# Patient Record
Sex: Male | Born: 1990 | Race: White | Hispanic: No | Marital: Single
Health system: Southern US, Community
[De-identification: ages and names within clinical notes are randomized; demographics above are authoritative.]

---
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
## 2013-10-22 ENCOUNTER — Ambulatory Visit
Admission: RE | Admit: 2013-10-22 | Discharge: 2013-10-22 | Disposition: A | Discharge status: Home / Self Care | Payer: Managed Care, Other (non HMO) | Source: Ambulatory Visit | Attending: Internal Medicine | Admitting: Internal Medicine

## 2013-10-22 ENCOUNTER — Other Ambulatory Visit: Payer: Self-pay | Admitting: Internal Medicine

## 2013-10-22 DIAGNOSIS — S62309A Unspecified fracture of unspecified metacarpal bone, initial encounter for closed fracture: Secondary | ICD-10-CM

## 2017-09-26 ENCOUNTER — Other Ambulatory Visit: Payer: Self-pay | Admitting: Internal Medicine

## 2017-09-26 ENCOUNTER — Ambulatory Visit
Admission: RE | Admit: 2017-09-26 | Discharge: 2017-09-26 | Disposition: A | Discharge status: Home / Self Care | Payer: PRIVATE HEALTH INSURANCE | Source: Ambulatory Visit | Attending: Internal Medicine | Admitting: Internal Medicine

## 2017-09-26 DIAGNOSIS — R609 Edema, unspecified: Secondary | ICD-10-CM

## 2017-09-26 DIAGNOSIS — R52 Pain, unspecified: Secondary | ICD-10-CM

## 2018-08-06 DIAGNOSIS — F909 Attention-deficit hyperactivity disorder, unspecified type: Secondary | ICD-10-CM | POA: Diagnosis not present

## 2018-09-20 IMAGING — CR DG ANKLE COMPLETE 3+V*L*
3 series · 3 of 3 positions shown · non-contrast
Comparison: None.

CLINICAL DATA: Anterior left ankle pain for 2 years.

EXAM:
LEFT ANKLE COMPLETE - 3+ VIEW

[x ankle ap left]
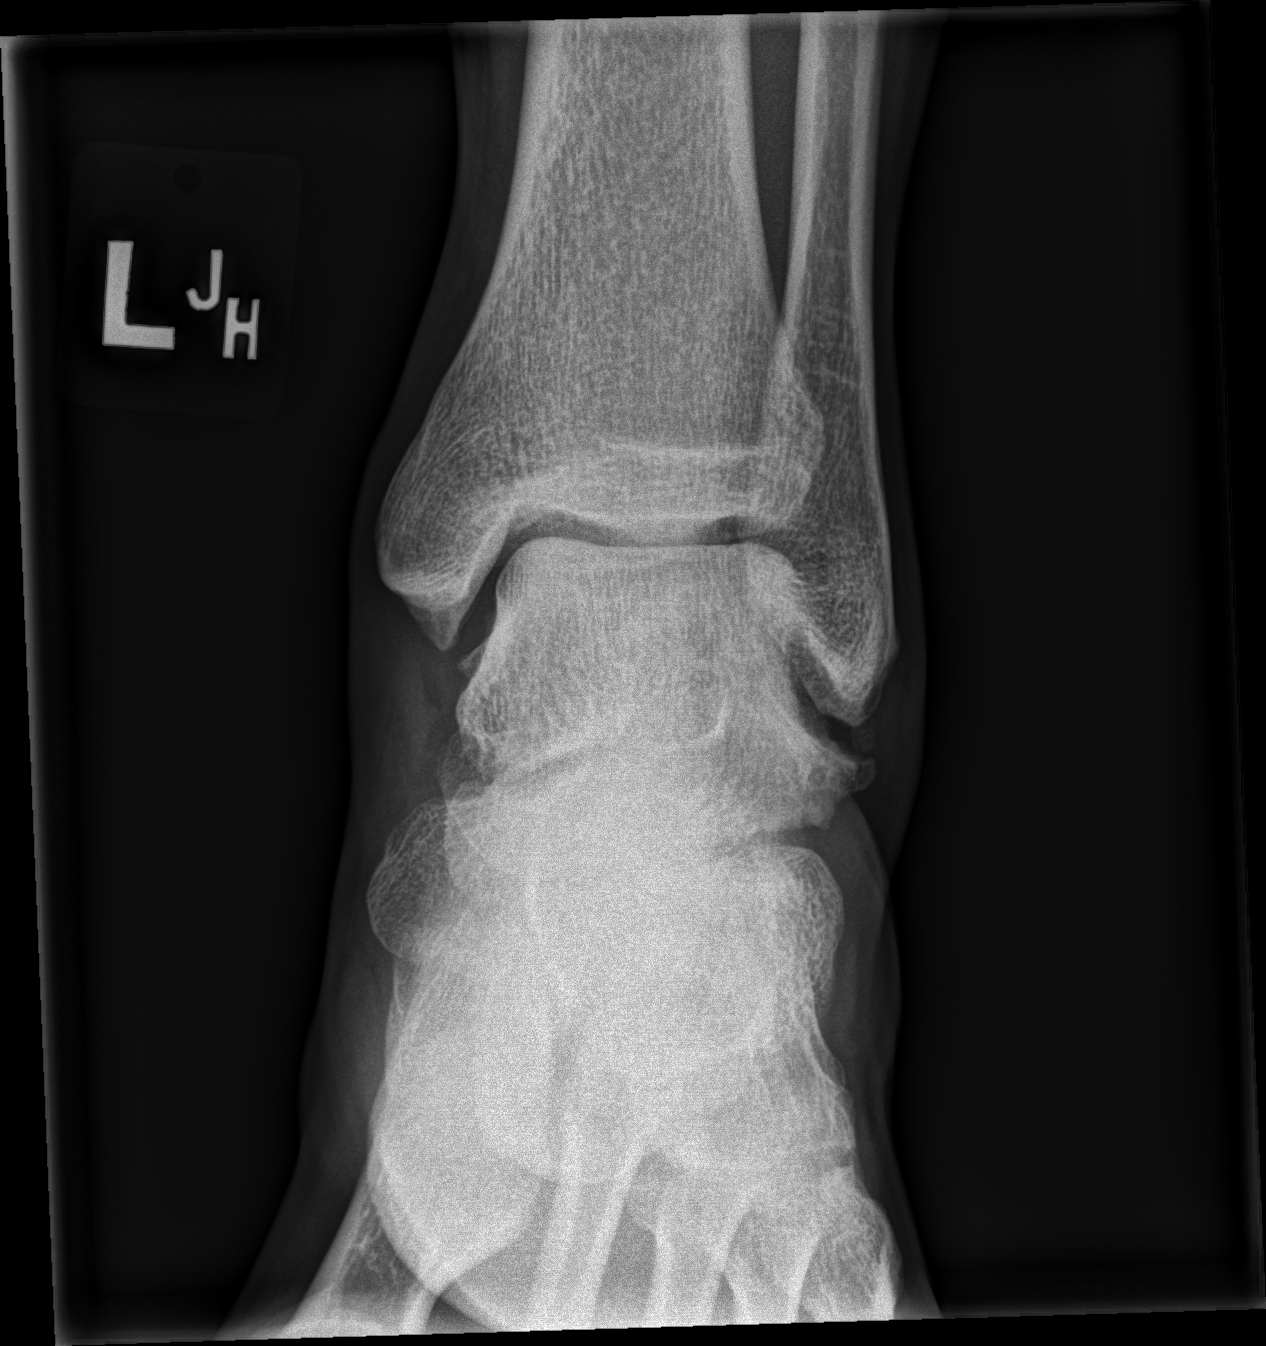

[x ankle obl left]
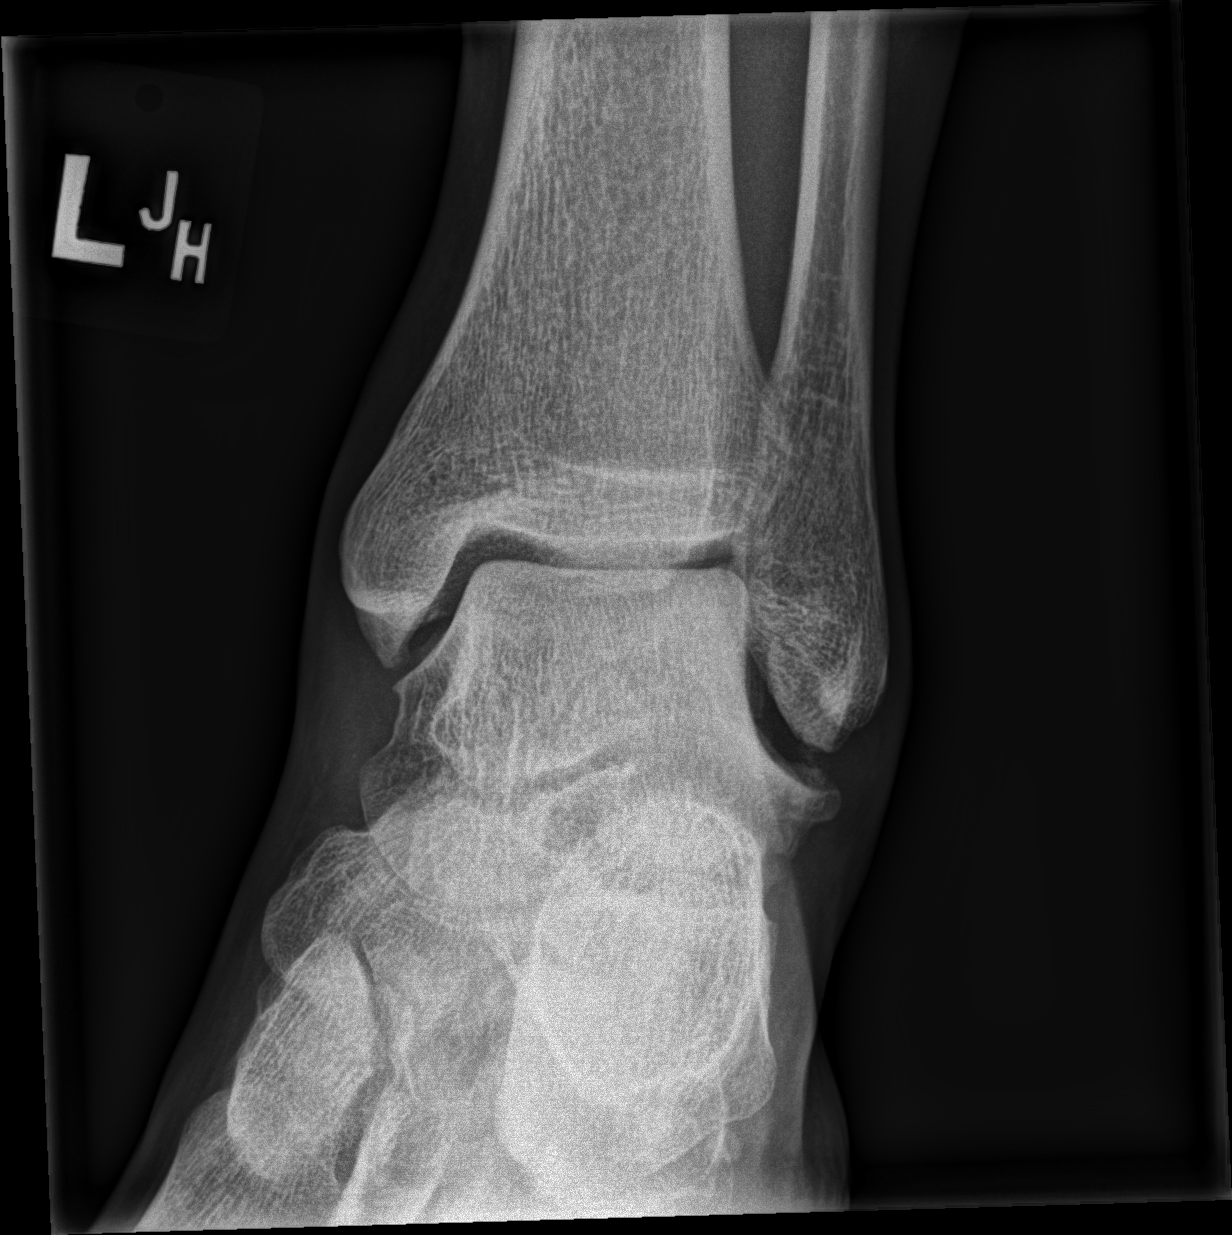

[x ankle lat left]
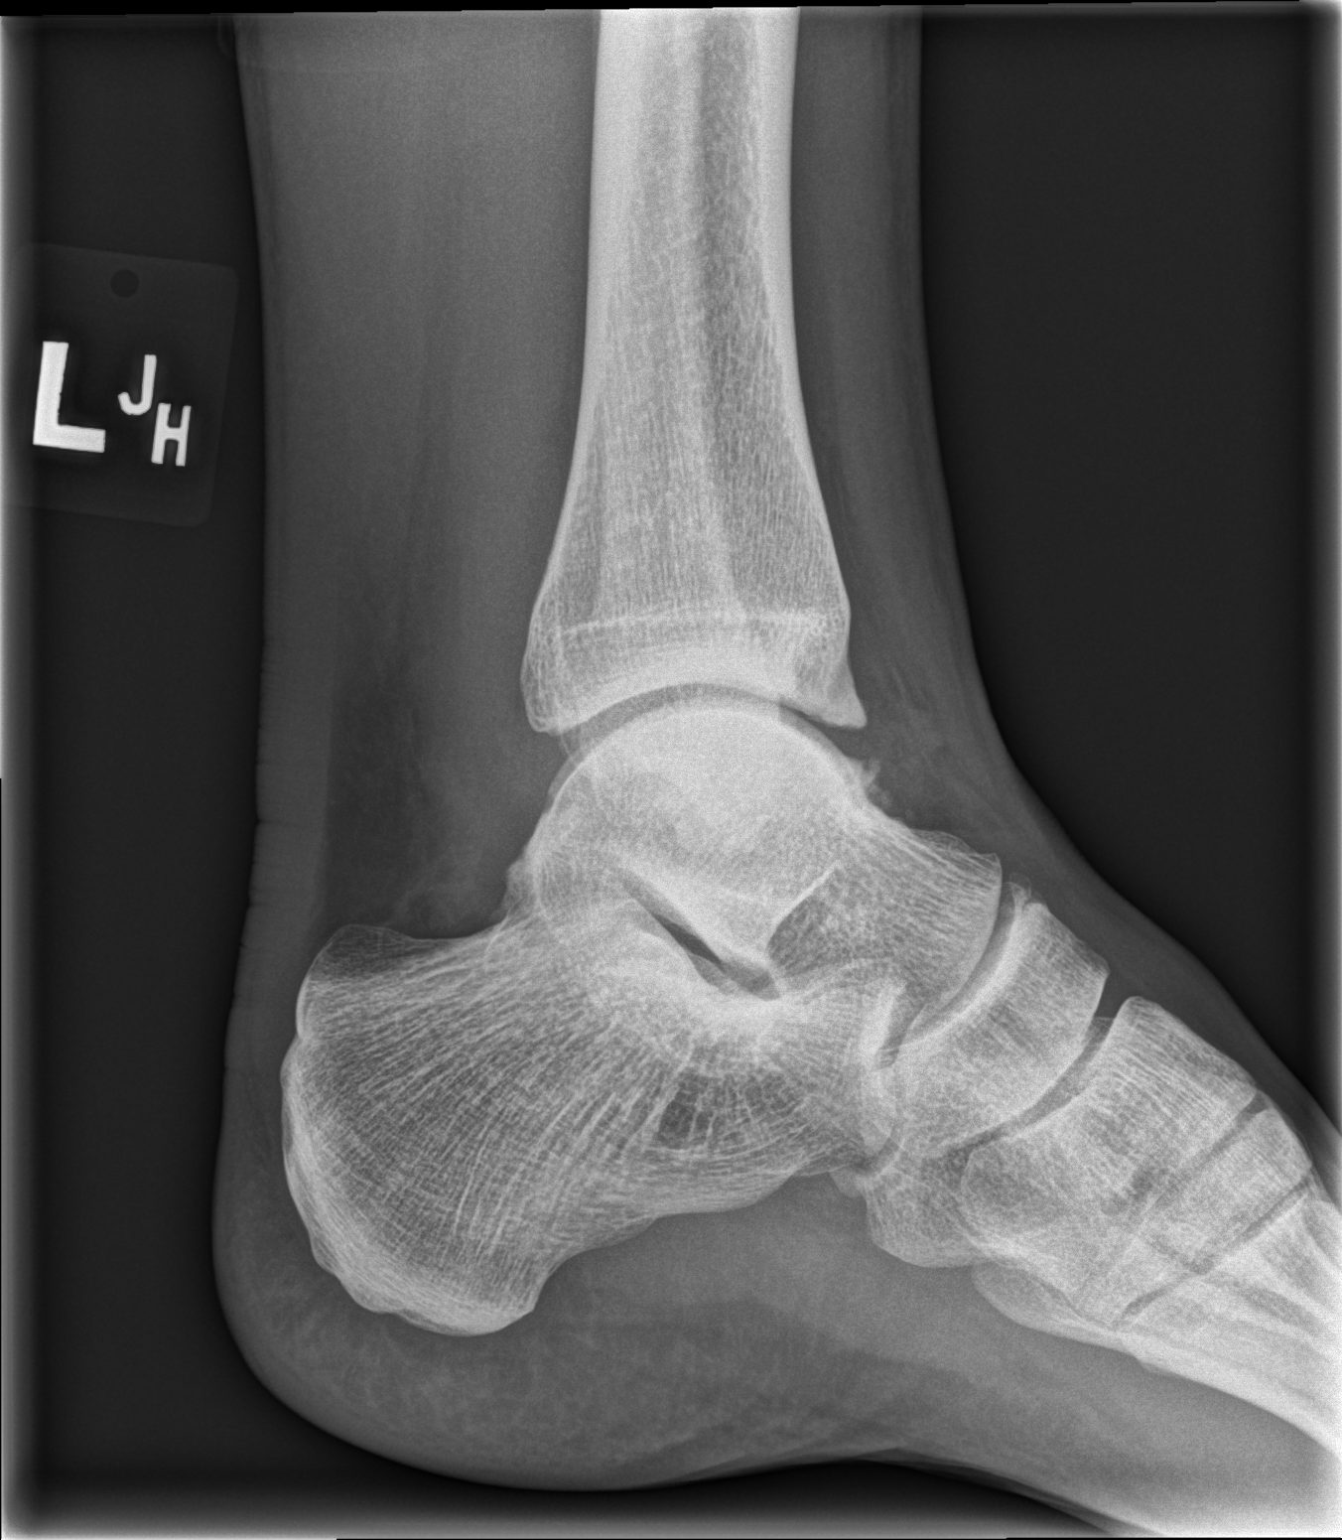

[3 of 3 positions shown; findings below may reference images not displayed]

FINDINGS: Mild spurring noted in the left ankle joint. Joint space is
maintained. No acute bony abnormality. Specifically, no fracture,
subluxation, or dislocation.
IMPRESSION: Mild spurring compatible with degenerative changes in the left
ankle. No acute bony abnormality.

## 2018-12-16 DIAGNOSIS — F4323 Adjustment disorder with mixed anxiety and depressed mood: Secondary | ICD-10-CM | POA: Diagnosis not present

## 2019-01-25 DIAGNOSIS — F4323 Adjustment disorder with mixed anxiety and depressed mood: Secondary | ICD-10-CM | POA: Diagnosis not present

## 2019-02-25 DIAGNOSIS — Z23 Encounter for immunization: Secondary | ICD-10-CM | POA: Diagnosis not present

## 2019-02-25 DIAGNOSIS — R1033 Periumbilical pain: Secondary | ICD-10-CM | POA: Diagnosis not present

## 2019-03-26 DIAGNOSIS — F4323 Adjustment disorder with mixed anxiety and depressed mood: Secondary | ICD-10-CM | POA: Diagnosis not present

## 2019-04-08 DIAGNOSIS — F4323 Adjustment disorder with mixed anxiety and depressed mood: Secondary | ICD-10-CM | POA: Diagnosis not present

## 2019-04-15 DIAGNOSIS — F4323 Adjustment disorder with mixed anxiety and depressed mood: Secondary | ICD-10-CM | POA: Diagnosis not present

## 2019-04-23 DIAGNOSIS — F4323 Adjustment disorder with mixed anxiety and depressed mood: Secondary | ICD-10-CM | POA: Diagnosis not present

## 2019-04-30 DIAGNOSIS — F4323 Adjustment disorder with mixed anxiety and depressed mood: Secondary | ICD-10-CM | POA: Diagnosis not present

## 2019-05-28 DIAGNOSIS — N3289 Other specified disorders of bladder: Secondary | ICD-10-CM | POA: Diagnosis not present

## 2019-06-14 ENCOUNTER — Ambulatory Visit: Payer: PRIVATE HEALTH INSURANCE | Attending: Internal Medicine

## 2019-06-14 DIAGNOSIS — Z20822 Contact with and (suspected) exposure to covid-19: Secondary | ICD-10-CM

## 2019-06-15 ENCOUNTER — Other Ambulatory Visit: Payer: PRIVATE HEALTH INSURANCE

## 2019-06-15 LAB — NOVEL CORONAVIRUS, NAA: SARS-CoV-2, NAA: NOT DETECTED

## 2019-07-07 DIAGNOSIS — Z733 Stress, not elsewhere classified: Secondary | ICD-10-CM | POA: Diagnosis not present

## 2019-07-07 DIAGNOSIS — K529 Noninfective gastroenteritis and colitis, unspecified: Secondary | ICD-10-CM | POA: Diagnosis not present

## 2019-09-02 DIAGNOSIS — Z23 Encounter for immunization: Secondary | ICD-10-CM | POA: Diagnosis not present
